# Patient Record
Sex: Female | Born: 1995 | Race: White | Hispanic: No | Marital: Single | Smoking: Never smoker
Health system: Southern US, Community
[De-identification: ages and names within clinical notes are randomized; demographics above are authoritative.]

---
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
## 2017-01-24 DIAGNOSIS — J301 Allergic rhinitis due to pollen: Secondary | ICD-10-CM | POA: Diagnosis not present

## 2017-01-24 DIAGNOSIS — J3081 Allergic rhinitis due to animal (cat) (dog) hair and dander: Secondary | ICD-10-CM | POA: Diagnosis not present

## 2017-01-24 DIAGNOSIS — J3089 Other allergic rhinitis: Secondary | ICD-10-CM | POA: Diagnosis not present

## 2017-01-24 DIAGNOSIS — L503 Dermatographic urticaria: Secondary | ICD-10-CM | POA: Diagnosis not present

## 2017-01-30 DIAGNOSIS — L700 Acne vulgaris: Secondary | ICD-10-CM | POA: Diagnosis not present

## 2017-01-30 DIAGNOSIS — D225 Melanocytic nevi of trunk: Secondary | ICD-10-CM | POA: Diagnosis not present

## 2017-04-05 DIAGNOSIS — S43005A Unspecified dislocation of left shoulder joint, initial encounter: Secondary | ICD-10-CM | POA: Diagnosis not present

## 2017-04-08 DIAGNOSIS — R4582 Worries: Secondary | ICD-10-CM | POA: Diagnosis not present

## 2017-04-09 DIAGNOSIS — S43005D Unspecified dislocation of left shoulder joint, subsequent encounter: Secondary | ICD-10-CM | POA: Diagnosis not present

## 2017-04-09 DIAGNOSIS — M25612 Stiffness of left shoulder, not elsewhere classified: Secondary | ICD-10-CM | POA: Diagnosis not present

## 2017-04-09 DIAGNOSIS — M25512 Pain in left shoulder: Secondary | ICD-10-CM | POA: Diagnosis not present

## 2017-04-09 DIAGNOSIS — M25312 Other instability, left shoulder: Secondary | ICD-10-CM | POA: Diagnosis not present

## 2017-04-10 DIAGNOSIS — M25312 Other instability, left shoulder: Secondary | ICD-10-CM | POA: Diagnosis not present

## 2017-04-10 DIAGNOSIS — S43005D Unspecified dislocation of left shoulder joint, subsequent encounter: Secondary | ICD-10-CM | POA: Diagnosis not present

## 2017-04-10 DIAGNOSIS — M25512 Pain in left shoulder: Secondary | ICD-10-CM | POA: Diagnosis not present

## 2017-04-10 DIAGNOSIS — M25612 Stiffness of left shoulder, not elsewhere classified: Secondary | ICD-10-CM | POA: Diagnosis not present

## 2017-05-31 DIAGNOSIS — H6641 Suppurative otitis media, unspecified, right ear: Secondary | ICD-10-CM | POA: Diagnosis not present

## 2017-05-31 DIAGNOSIS — J069 Acute upper respiratory infection, unspecified: Secondary | ICD-10-CM | POA: Diagnosis not present

## 2017-06-18 IMAGING — MR MR HEAD W/O CM
10 series · 48 of 48 positions shown · non-contrast
Comparison: None.

CLINICAL DATA: Persistent headaches since [DATE]. Lightheaded,
dizziness, and nausea. Complicated migraine.

EXAM:
MRI HEAD WITHOUT CONTRAST
TECHNIQUE: Multiplanar, multiecho pulse sequences of the brain and surrounding
structures were obtained without intravenous contrast.

[Series 2: t1_se_sag · sagittal · 5.0mm · 0.45mm/px · 3 of 21 slices shown]
[im 1/21]
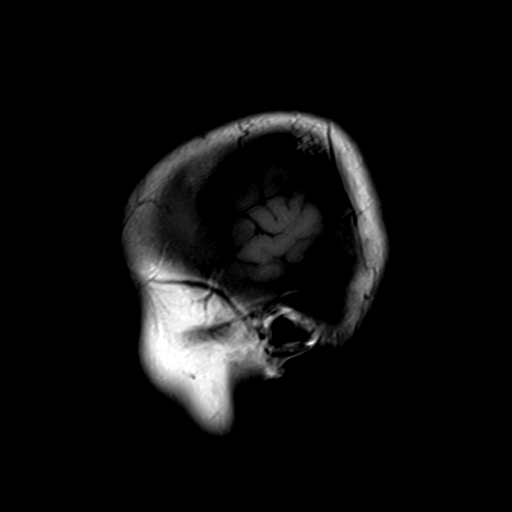
[im 11/21]
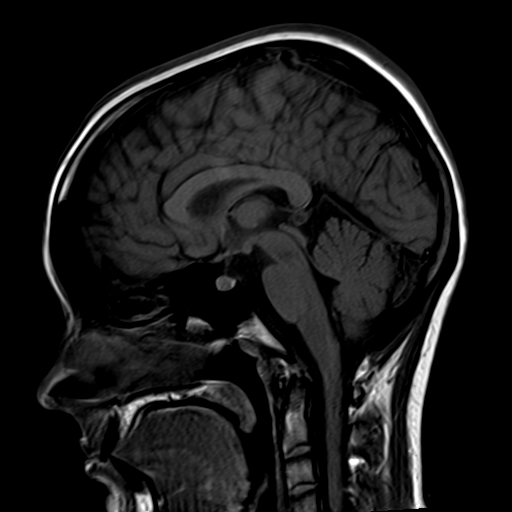
[im 21/21]
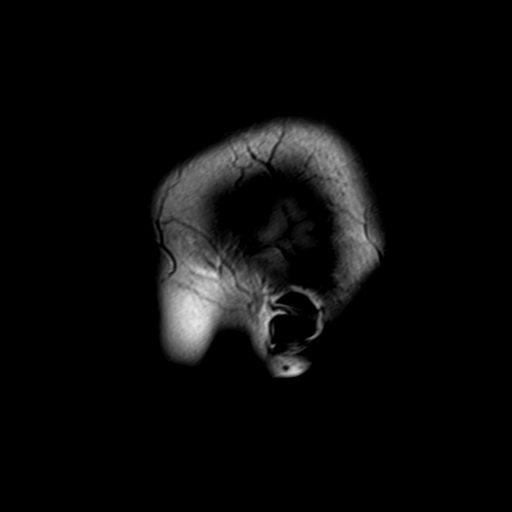

[Series 3: ep2d_diff_(id)_trace · axial · 3.0mm · 1.80mm/px · z∈[-45,+102]mm · 10 of 100 slices shown]
[im 1/100]
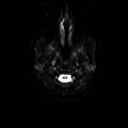
[im 12/100]
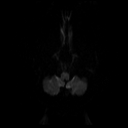
[im 23/100]
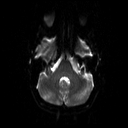
[im 34/100]
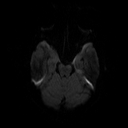
[im 45/100]
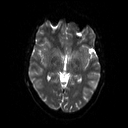
[im 56/100]
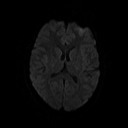
[im 67/100]
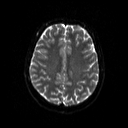
[im 78/100]
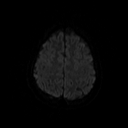
[im 89/100]
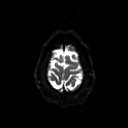
[im 100/100]
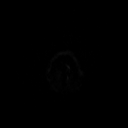

[Series 4: ep2d_diff_(id)_trace_adc · axial · 3.0mm · 1.80mm/px · z∈[-45,+102]mm · 5 of 50 slices shown]
[im 1/50]
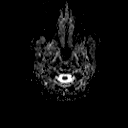
[im 13/50]
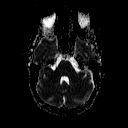
[im 25/50]
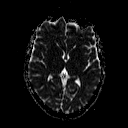
[im 37/50]
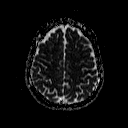
[im 50/50]
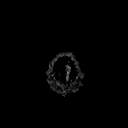

[Series 6: swi_images · axial · 2.0mm · 0.90mm/px · z∈[-50,+108]mm · 8 of 80 slices shown]
[im 1/80]
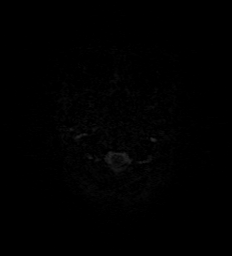
[im 12/80]
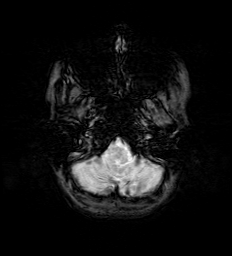
[im 23/80]
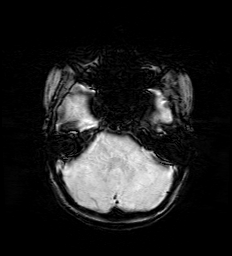
[im 34/80]
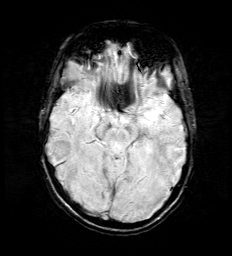
[im 46/80]
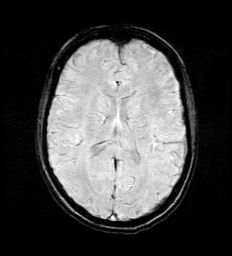
[im 57/80]
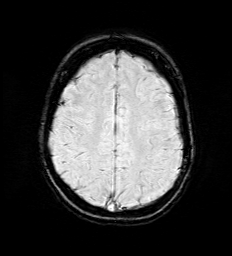
[im 68/80]
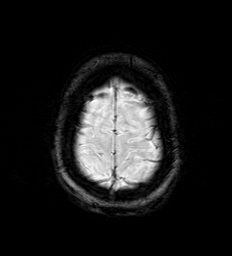
[im 80/80]
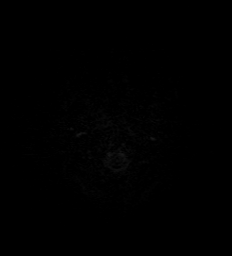

[Series 7: ep2d_diff_cor · coronal · 5.0mm · 1.77mm/px · 5 of 48 slices shown]
[im 1/48]
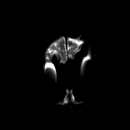
[im 12/48]
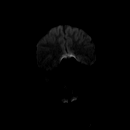
[im 24/48]
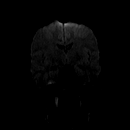
[im 36/48]
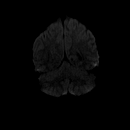
[im 48/48]
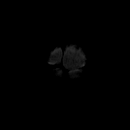

[Series 8: ep2d_diff_cor_adc · coronal · 5.0mm · 1.77mm/px · 2 of 24 slices shown]
[im 1/24]
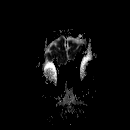
[im 24/24]
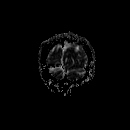

[Series 9: FLAIR · axial · 5.0mm · 0.45mm/px · z∈[-40,+97]mm · 2 of 23 slices shown]
[im 1/23]
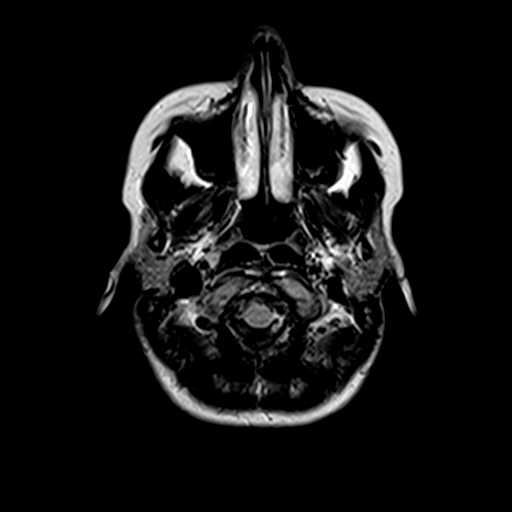
[im 23/23]
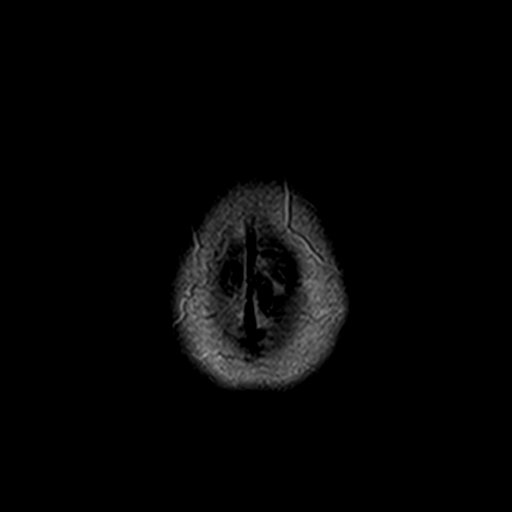

[Series 10: t2_tse_tra_512 · axial · 5.0mm · 0.60mm/px · z∈[-39,+98]mm · 2 of 23 slices shown]
[im 1/23]
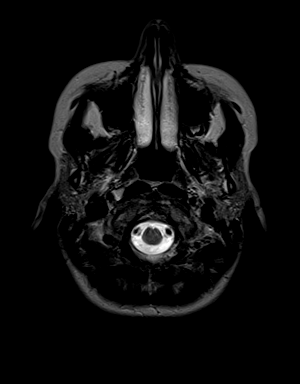
[im 23/23]
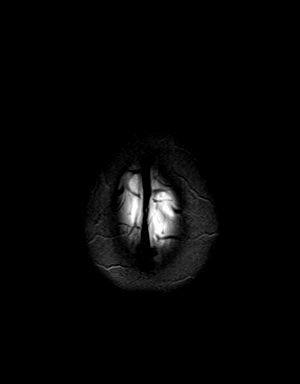

[Series 11: t1_mpr_tra · axial · 2.0mm · 0.45mm/px · z∈[-49,+108]mm · 8 of 80 slices shown]
[im 1/80]
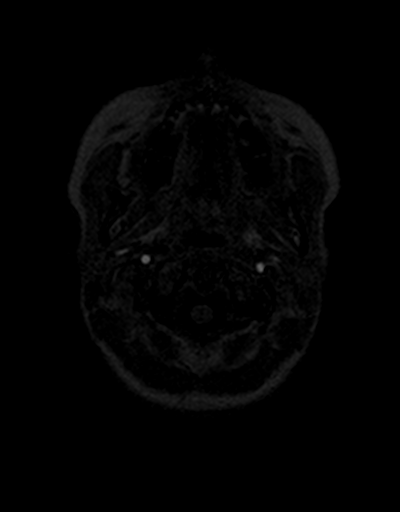
[im 12/80]
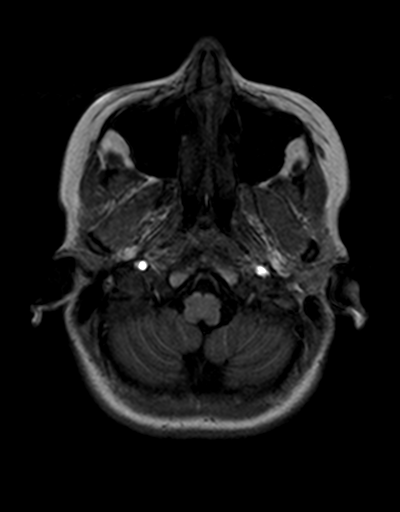
[im 23/80]
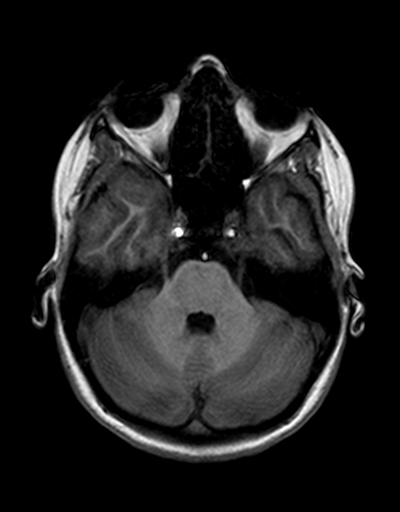
[im 34/80]
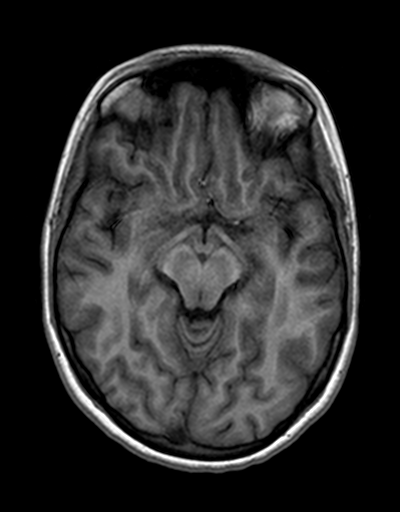
[im 46/80]
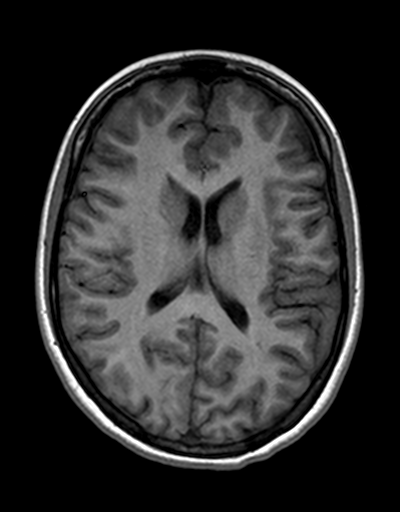
[im 57/80]
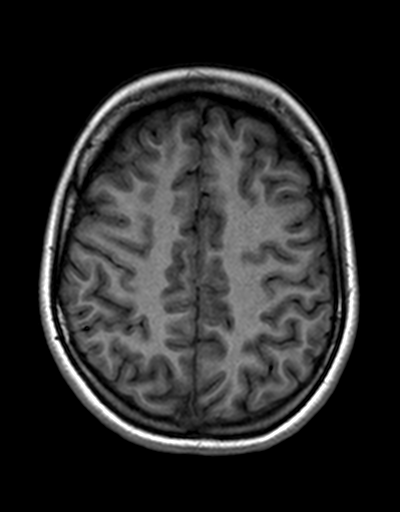
[im 68/80]
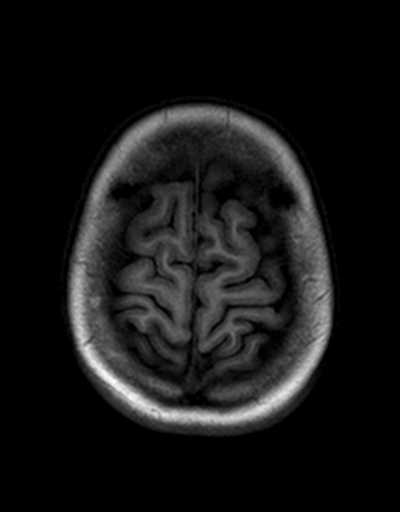
[im 80/80]
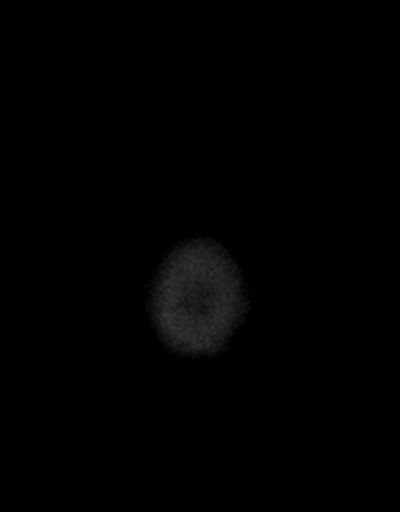

[Series 12: T2 · coronal · 5.0mm · 0.45mm/px · 3 of 25 slices shown]
[im 1/25]
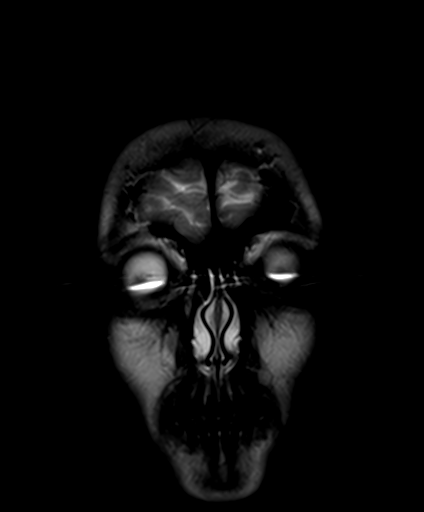
[im 13/25]
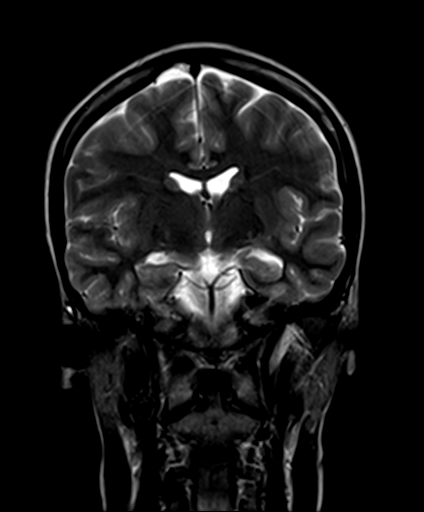
[im 25/25]
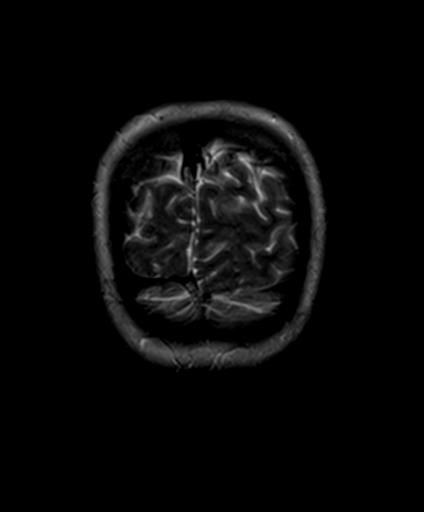

[48 of 48 positions shown; findings below may reference images not displayed]

FINDINGS: Some sequences are mildly motion degraded.

There is no evidence of acute infarct, intracranial hemorrhage,
mass, midline shift, or extra-axial fluid collection. Ventricles and
sulci are normal for age. No brain parenchymal signal abnormality is
identified.

Orbits are unremarkable. Mastoid air cells are clear. A left
sphenoid sinus mucous retention cyst is noted. Major intracranial
vascular flow voids are preserved. Calvarium and scalp soft tissues
are unremarkable.
IMPRESSION: Unremarkable appearance of the brain.

## 2017-07-03 DIAGNOSIS — Z23 Encounter for immunization: Secondary | ICD-10-CM | POA: Diagnosis not present

## 2017-08-07 DIAGNOSIS — N898 Other specified noninflammatory disorders of vagina: Secondary | ICD-10-CM | POA: Diagnosis not present

## 2017-08-15 ENCOUNTER — Encounter: Payer: Self-pay | Admitting: Sports Medicine

## 2017-08-15 ENCOUNTER — Ambulatory Visit: Payer: BLUE CROSS/BLUE SHIELD | Admitting: Sports Medicine

## 2017-08-15 DIAGNOSIS — M357 Hypermobility syndrome: Secondary | ICD-10-CM

## 2017-08-15 DIAGNOSIS — R269 Unspecified abnormalities of gait and mobility: Secondary | ICD-10-CM

## 2017-08-15 NOTE — Assessment & Plan Note (Signed)
New orthotics today  Needs to stay in these for any shoes where they fit  Reck if nay problems breaking these in

## 2017-08-15 NOTE — Progress Notes (Signed)
CC; Bilateral foot and arch pain  Patient with hypermobility Did well in orthotics past 2 years With wear and sports - tennis - these have broken down No swelling in feet Pain along medial arch  Past HX Shoulder dislocation over past year Now feeling stable Hx of other joint subluxation  ROS Denies POTS sxs No dysautonomia  PE Pleasant athletic F in NAD BP 106/65   Ht 5\' 5"  (1.651 m)   Wt 126 lb (57.2 kg)   BMI 20.97 kg/m   Increased mobility of shoulders Hyperextension of both elbows Hands normal Ankles and feet with increased mobility Standing shows marked pronation bilat. Subtalar subluxation with some foot turnout RT > Lt  Gait is pronated  Patient was fitted for a : standard, cushioned, semi-rigid orthotic. The orthotic was heated and afterward the patient stood on the orthotic blank positioned on the orthotic stand. The patient was positioned in subtalar neutral position and 10 degrees of ankle dorsiflexion in a weight bearing stance. After completion of molding, a stable base was applied to the orthotic blank. The blank was ground to a stable position for weight bearing. Size: 7 red EVA Base: blue med density EVA Posting:none Additional orthotic padding:none  Gait post orthotics was corrected to neutral with walk and jog Better pain relief

## 2017-08-15 NOTE — Assessment & Plan Note (Signed)
Has had shoulder dislocation this past year  Now hx of knee and shoulder sublux/ disloc  Emphasized doing some short arch strength exercise for stability

## 2017-08-29 ENCOUNTER — Encounter: Payer: Self-pay | Admitting: Sports Medicine

## 2017-09-28 DIAGNOSIS — J029 Acute pharyngitis, unspecified: Secondary | ICD-10-CM | POA: Diagnosis not present

## 2017-10-20 DIAGNOSIS — J101 Influenza due to other identified influenza virus with other respiratory manifestations: Secondary | ICD-10-CM | POA: Diagnosis not present

## 2017-10-20 DIAGNOSIS — J029 Acute pharyngitis, unspecified: Secondary | ICD-10-CM | POA: Diagnosis not present

## 2017-10-21 DIAGNOSIS — G43A0 Cyclical vomiting, not intractable: Secondary | ICD-10-CM | POA: Diagnosis not present

## 2017-10-30 DIAGNOSIS — J029 Acute pharyngitis, unspecified: Secondary | ICD-10-CM | POA: Diagnosis not present

## 2017-10-31 DIAGNOSIS — J029 Acute pharyngitis, unspecified: Secondary | ICD-10-CM | POA: Diagnosis not present

## 2017-12-10 DIAGNOSIS — J029 Acute pharyngitis, unspecified: Secondary | ICD-10-CM | POA: Diagnosis not present

## 2017-12-31 DIAGNOSIS — J019 Acute sinusitis, unspecified: Secondary | ICD-10-CM | POA: Diagnosis not present

## 2017-12-31 DIAGNOSIS — J358 Other chronic diseases of tonsils and adenoids: Secondary | ICD-10-CM | POA: Diagnosis not present

## 2017-12-31 DIAGNOSIS — J029 Acute pharyngitis, unspecified: Secondary | ICD-10-CM | POA: Diagnosis not present

## 2018-01-15 DIAGNOSIS — J209 Acute bronchitis, unspecified: Secondary | ICD-10-CM | POA: Diagnosis not present

## 2018-01-15 DIAGNOSIS — J019 Acute sinusitis, unspecified: Secondary | ICD-10-CM | POA: Diagnosis not present

## 2018-01-27 DIAGNOSIS — L503 Dermatographic urticaria: Secondary | ICD-10-CM | POA: Diagnosis not present

## 2018-01-27 DIAGNOSIS — J301 Allergic rhinitis due to pollen: Secondary | ICD-10-CM | POA: Diagnosis not present

## 2018-01-27 DIAGNOSIS — J3089 Other allergic rhinitis: Secondary | ICD-10-CM | POA: Diagnosis not present

## 2018-01-27 DIAGNOSIS — J3081 Allergic rhinitis due to animal (cat) (dog) hair and dander: Secondary | ICD-10-CM | POA: Diagnosis not present

## 2018-01-28 DIAGNOSIS — Z13220 Encounter for screening for lipoid disorders: Secondary | ICD-10-CM | POA: Diagnosis not present

## 2018-01-28 DIAGNOSIS — Z1331 Encounter for screening for depression: Secondary | ICD-10-CM | POA: Diagnosis not present

## 2018-01-28 DIAGNOSIS — Z713 Dietary counseling and surveillance: Secondary | ICD-10-CM | POA: Diagnosis not present

## 2018-01-28 DIAGNOSIS — Z0000 Encounter for general adult medical examination without abnormal findings: Secondary | ICD-10-CM | POA: Diagnosis not present

## 2018-03-04 DIAGNOSIS — Z23 Encounter for immunization: Secondary | ICD-10-CM | POA: Diagnosis not present

## 2018-08-08 DIAGNOSIS — J029 Acute pharyngitis, unspecified: Secondary | ICD-10-CM | POA: Diagnosis not present

## 2018-08-08 DIAGNOSIS — R599 Enlarged lymph nodes, unspecified: Secondary | ICD-10-CM | POA: Diagnosis not present

## 2018-09-08 DIAGNOSIS — J029 Acute pharyngitis, unspecified: Secondary | ICD-10-CM | POA: Diagnosis not present

## 2018-09-10 DIAGNOSIS — J029 Acute pharyngitis, unspecified: Secondary | ICD-10-CM | POA: Diagnosis not present

## 2018-10-10 DIAGNOSIS — J039 Acute tonsillitis, unspecified: Secondary | ICD-10-CM | POA: Diagnosis not present

## 2018-11-03 DIAGNOSIS — J358 Other chronic diseases of tonsils and adenoids: Secondary | ICD-10-CM | POA: Diagnosis not present

## 2018-11-03 DIAGNOSIS — Z7289 Other problems related to lifestyle: Secondary | ICD-10-CM | POA: Diagnosis not present

## 2019-02-25 DIAGNOSIS — J020 Streptococcal pharyngitis: Secondary | ICD-10-CM | POA: Diagnosis not present

## 2019-03-02 DIAGNOSIS — J3089 Other allergic rhinitis: Secondary | ICD-10-CM | POA: Diagnosis not present

## 2019-03-02 DIAGNOSIS — L503 Dermatographic urticaria: Secondary | ICD-10-CM | POA: Diagnosis not present

## 2019-03-02 DIAGNOSIS — J301 Allergic rhinitis due to pollen: Secondary | ICD-10-CM | POA: Diagnosis not present

## 2019-03-02 DIAGNOSIS — J3081 Allergic rhinitis due to animal (cat) (dog) hair and dander: Secondary | ICD-10-CM | POA: Diagnosis not present

## 2019-03-05 DIAGNOSIS — J029 Acute pharyngitis, unspecified: Secondary | ICD-10-CM | POA: Diagnosis not present

## 2019-03-11 DIAGNOSIS — Z7251 High risk heterosexual behavior: Secondary | ICD-10-CM | POA: Diagnosis not present

## 2019-03-11 DIAGNOSIS — Z113 Encounter for screening for infections with a predominantly sexual mode of transmission: Secondary | ICD-10-CM | POA: Diagnosis not present

## 2019-03-11 DIAGNOSIS — J029 Acute pharyngitis, unspecified: Secondary | ICD-10-CM | POA: Diagnosis not present

## 2019-03-11 DIAGNOSIS — H9201 Otalgia, right ear: Secondary | ICD-10-CM | POA: Diagnosis not present

## 2019-03-11 DIAGNOSIS — R350 Frequency of micturition: Secondary | ICD-10-CM | POA: Diagnosis not present

## 2019-03-12 DIAGNOSIS — N3001 Acute cystitis with hematuria: Secondary | ICD-10-CM | POA: Diagnosis not present

## 2019-03-12 DIAGNOSIS — R300 Dysuria: Secondary | ICD-10-CM | POA: Diagnosis not present

## 2019-03-19 DIAGNOSIS — Z1159 Encounter for screening for other viral diseases: Secondary | ICD-10-CM | POA: Diagnosis not present

## 2019-03-23 DIAGNOSIS — J0391 Acute recurrent tonsillitis, unspecified: Secondary | ICD-10-CM | POA: Diagnosis not present

## 2019-03-23 DIAGNOSIS — J353 Hypertrophy of tonsils with hypertrophy of adenoids: Secondary | ICD-10-CM | POA: Diagnosis not present

## 2019-03-23 DIAGNOSIS — J351 Hypertrophy of tonsils: Secondary | ICD-10-CM | POA: Diagnosis not present

## 2019-04-15 DIAGNOSIS — Z20828 Contact with and (suspected) exposure to other viral communicable diseases: Secondary | ICD-10-CM | POA: Diagnosis not present

## 2019-04-18 DIAGNOSIS — Z20828 Contact with and (suspected) exposure to other viral communicable diseases: Secondary | ICD-10-CM | POA: Diagnosis not present

## 2019-05-19 DIAGNOSIS — R300 Dysuria: Secondary | ICD-10-CM | POA: Diagnosis not present

## 2019-05-21 DIAGNOSIS — Z113 Encounter for screening for infections with a predominantly sexual mode of transmission: Secondary | ICD-10-CM | POA: Diagnosis not present

## 2019-05-21 DIAGNOSIS — R300 Dysuria: Secondary | ICD-10-CM | POA: Diagnosis not present

## 2019-07-16 DIAGNOSIS — Z20828 Contact with and (suspected) exposure to other viral communicable diseases: Secondary | ICD-10-CM | POA: Diagnosis not present

## 2019-07-20 DIAGNOSIS — Z20828 Contact with and (suspected) exposure to other viral communicable diseases: Secondary | ICD-10-CM | POA: Diagnosis not present

## 2019-08-25 DIAGNOSIS — H53143 Visual discomfort, bilateral: Secondary | ICD-10-CM | POA: Diagnosis not present

## 2019-08-31 ENCOUNTER — Other Ambulatory Visit: Payer: BC Managed Care – PPO

## 2019-08-31 ENCOUNTER — Ambulatory Visit: Payer: BC Managed Care – PPO | Attending: Internal Medicine

## 2019-08-31 DIAGNOSIS — Z03818 Encounter for observation for suspected exposure to other biological agents ruled out: Secondary | ICD-10-CM | POA: Diagnosis not present

## 2019-08-31 DIAGNOSIS — R238 Other skin changes: Secondary | ICD-10-CM

## 2019-08-31 DIAGNOSIS — U071 COVID-19: Secondary | ICD-10-CM

## 2019-09-01 LAB — NOVEL CORONAVIRUS, NAA: SARS-CoV-2, NAA: NOT DETECTED

## 2019-09-04 ENCOUNTER — Other Ambulatory Visit: Payer: BC Managed Care – PPO

## 2019-09-08 DIAGNOSIS — Z0000 Encounter for general adult medical examination without abnormal findings: Secondary | ICD-10-CM | POA: Diagnosis not present

## 2019-10-06 DIAGNOSIS — Z03818 Encounter for observation for suspected exposure to other biological agents ruled out: Secondary | ICD-10-CM | POA: Diagnosis not present

## 2019-11-02 DIAGNOSIS — J029 Acute pharyngitis, unspecified: Secondary | ICD-10-CM | POA: Diagnosis not present

## 2019-12-02 DIAGNOSIS — Z23 Encounter for immunization: Secondary | ICD-10-CM | POA: Diagnosis not present

## 2020-05-04 DIAGNOSIS — Z202 Contact with and (suspected) exposure to infections with a predominantly sexual mode of transmission: Secondary | ICD-10-CM | POA: Diagnosis not present

## 2020-05-04 DIAGNOSIS — Z113 Encounter for screening for infections with a predominantly sexual mode of transmission: Secondary | ICD-10-CM | POA: Diagnosis not present

## 2020-07-25 DIAGNOSIS — Z23 Encounter for immunization: Secondary | ICD-10-CM | POA: Diagnosis not present

## 2020-09-19 DIAGNOSIS — S43395A Dislocation of other parts of left shoulder girdle, initial encounter: Secondary | ICD-10-CM | POA: Diagnosis not present

## 2020-09-23 DIAGNOSIS — M24412 Recurrent dislocation, left shoulder: Secondary | ICD-10-CM | POA: Diagnosis not present

## 2020-10-03 DIAGNOSIS — S43432A Superior glenoid labrum lesion of left shoulder, initial encounter: Secondary | ICD-10-CM | POA: Diagnosis not present

## 2020-10-03 DIAGNOSIS — R609 Edema, unspecified: Secondary | ICD-10-CM | POA: Diagnosis not present

## 2020-10-03 DIAGNOSIS — M25512 Pain in left shoulder: Secondary | ICD-10-CM | POA: Diagnosis not present

## 2020-10-07 DIAGNOSIS — Z23 Encounter for immunization: Secondary | ICD-10-CM | POA: Diagnosis not present

## 2020-10-07 DIAGNOSIS — Z0000 Encounter for general adult medical examination without abnormal findings: Secondary | ICD-10-CM | POA: Diagnosis not present

## 2020-10-10 DIAGNOSIS — S43025A Posterior dislocation of left humerus, initial encounter: Secondary | ICD-10-CM | POA: Diagnosis not present

## 2020-10-11 DIAGNOSIS — M25512 Pain in left shoulder: Secondary | ICD-10-CM | POA: Diagnosis not present

## 2020-10-19 DIAGNOSIS — M25512 Pain in left shoulder: Secondary | ICD-10-CM | POA: Diagnosis not present

## 2020-11-16 DIAGNOSIS — M25512 Pain in left shoulder: Secondary | ICD-10-CM | POA: Diagnosis not present

## 2020-11-30 DIAGNOSIS — M25512 Pain in left shoulder: Secondary | ICD-10-CM | POA: Diagnosis not present

## 2021-01-04 DIAGNOSIS — Z114 Encounter for screening for human immunodeficiency virus [HIV]: Secondary | ICD-10-CM | POA: Diagnosis not present

## 2021-01-04 DIAGNOSIS — R102 Pelvic and perineal pain: Secondary | ICD-10-CM | POA: Diagnosis not present

## 2021-01-04 DIAGNOSIS — Z113 Encounter for screening for infections with a predominantly sexual mode of transmission: Secondary | ICD-10-CM | POA: Diagnosis not present

## 2021-01-04 DIAGNOSIS — Z3200 Encounter for pregnancy test, result unknown: Secondary | ICD-10-CM | POA: Diagnosis not present

## 2021-01-04 DIAGNOSIS — N760 Acute vaginitis: Secondary | ICD-10-CM | POA: Diagnosis not present

## 2021-02-28 DIAGNOSIS — F411 Generalized anxiety disorder: Secondary | ICD-10-CM | POA: Diagnosis not present

## 2021-03-07 DIAGNOSIS — D225 Melanocytic nevi of trunk: Secondary | ICD-10-CM | POA: Diagnosis not present

## 2021-03-07 DIAGNOSIS — L700 Acne vulgaris: Secondary | ICD-10-CM | POA: Diagnosis not present

## 2021-03-23 DIAGNOSIS — L503 Dermatographic urticaria: Secondary | ICD-10-CM | POA: Diagnosis not present

## 2021-03-23 DIAGNOSIS — J301 Allergic rhinitis due to pollen: Secondary | ICD-10-CM | POA: Diagnosis not present

## 2021-03-23 DIAGNOSIS — J3089 Other allergic rhinitis: Secondary | ICD-10-CM | POA: Diagnosis not present

## 2021-03-23 DIAGNOSIS — J3081 Allergic rhinitis due to animal (cat) (dog) hair and dander: Secondary | ICD-10-CM | POA: Diagnosis not present

## 2021-05-03 DIAGNOSIS — J019 Acute sinusitis, unspecified: Secondary | ICD-10-CM | POA: Diagnosis not present

## 2021-05-10 DIAGNOSIS — Z23 Encounter for immunization: Secondary | ICD-10-CM | POA: Diagnosis not present

## 2021-05-10 DIAGNOSIS — R300 Dysuria: Secondary | ICD-10-CM | POA: Diagnosis not present

## 2021-05-10 DIAGNOSIS — B373 Candidiasis of vulva and vagina: Secondary | ICD-10-CM | POA: Diagnosis not present

## 2023-08-19 ENCOUNTER — Encounter: Payer: Self-pay | Admitting: Family Medicine
